# Patient Record
Sex: Male | Born: 1975 | Race: White | Hispanic: No | Marital: Married | Smoking: Current every day smoker
Health system: Southern US, Community
[De-identification: ages and names within clinical notes are randomized; demographics above are authoritative.]

## PROBLEM LIST (undated history)

## (undated) DIAGNOSIS — E785 Hyperlipidemia, unspecified: Secondary | ICD-10-CM

## (undated) HISTORY — DX: Hyperlipidemia, unspecified: E78.5

## (undated) HISTORY — PX: TONSILECTOMY, ADENOIDECTOMY, BILATERAL MYRINGOTOMY AND TUBES: SHX2538

---

## 2016-10-30 ENCOUNTER — Ambulatory Visit (INDEPENDENT_AMBULATORY_CARE_PROVIDER_SITE_OTHER): Payer: Commercial Managed Care - HMO | Admitting: Physician Assistant

## 2016-10-30 ENCOUNTER — Ambulatory Visit (INDEPENDENT_AMBULATORY_CARE_PROVIDER_SITE_OTHER): Payer: Commercial Managed Care - HMO

## 2016-10-30 VITALS — BP 152/90 | HR 99 | Temp 98.0°F | Resp 20 | Ht 69.5 in | Wt 178.0 lb

## 2016-10-30 DIAGNOSIS — R030 Elevated blood-pressure reading, without diagnosis of hypertension: Secondary | ICD-10-CM | POA: Diagnosis not present

## 2016-10-30 DIAGNOSIS — M25511 Pain in right shoulder: Secondary | ICD-10-CM | POA: Diagnosis not present

## 2016-10-30 DIAGNOSIS — M62838 Other muscle spasm: Secondary | ICD-10-CM | POA: Diagnosis not present

## 2016-10-30 MED ORDER — KETOROLAC TROMETHAMINE 60 MG/2ML IM SOLN
60.0000 mg | Freq: Once | INTRAMUSCULAR | Status: AC
  Administered 2016-10-30: 60 mg via INTRAMUSCULAR

## 2016-10-30 MED ORDER — CYCLOBENZAPRINE HCL 10 MG PO TABS: 10.0000 mg | ORAL_TABLET | Freq: Three times a day (TID) | ORAL | 0 refills | Status: AC | PRN

## 2016-10-30 MED ORDER — MELOXICAM 15 MG PO TABS: 15.0000 mg | ORAL_TABLET | Freq: Every day | ORAL | 1 refills | Status: AC

## 2016-10-30 NOTE — Patient Instructions (Addendum)
I recommend resting today. Take medications as prescribed and drink lots of water.   Just to know, flexeril can cause side effects that may impair your thinking or reactions. Be careful if you drive or do anything that requires you to be awake and alert. void drinking alcohol, which can increase some of the side effects of Flexeril.  NSAIDs like meloxicam have common side effects of heartburn, stomach pain, indigestion, and headache. Could lead to renal insufficiency, stroke, or GI bleed if taken excess amounts outside of what is recommended on label long term.    Also, use a heating pad, do not apply directly to skin, use barrier such as towel over the skin. Leave on for 15-20 minutes, 3-4 times a day.  Follow up with me in one week for annual physical exam, please be fasting at this exam so we can check all of your labs. You can make the appointment today.  In the meantime, if your symptoms worsen or you develop any new concerning symptoms, seek care immediately at the ER.       Muscle Cramps and Spasms Muscle cramps and spasms are when muscles tighten by themselves. They usually get better within minutes. Muscle cramps are painful. They are usually stronger and last longer than muscle spasms. Muscle spasms may or may not be painful. They can last a few seconds or much longer. HOME CARE  Drink enough fluid to keep your pee (urine) clear or pale yellow.  Massage, stretch, and relax the muscle.  Use a warm towel, heating pad, or warm shower water on tight muscles.  Place ice on the muscle if it is tender or in pain.  Put ice in a plastic bag.  Place a towel between your skin and the bag.  Leave the ice on for 15-20 minutes, 3-4 times a day.  Only take medicine as told by your doctor. GET HELP RIGHT AWAY IF:  Your cramps or spasms get worse, happen more often, or do not get better with time. MAKE SURE YOU:  Understand these instructions.  Will watch your condition.  Will get  help right away if you are not doing well or get worse. This information is not intended to replace advice given to you by your health care provider. Make sure you discuss any questions you have with your health care provider. Document Released: 09/19/2008 Document Revised: 02/01/2013 Document Reviewed: 07/11/2015 Elsevier Interactive Patient Education  2017 [employer]   IF you received an x-ray today, you will receive an invoice from [facility]. Please contact [facility] at [phone] with questions or concerns regarding your invoice.   IF you received labwork today, you will receive an invoice from [facility]. Please contact [facility] at [phone] with questions or concerns regarding your invoice.   Our billing staff will not be able to assist you with questions regarding bills from these companies.  You will be contacted with the lab results as soon as they are available. The fastest way to get your results is to activate your My Chart account. Instructions are located on the last page of this paperwork. If you have not heard from us regarding the results in 2 weeks, please contact this office.

## 2016-10-30 NOTE — Progress Notes (Signed)
Bryan Combs  MRN: [id number] DOB: Jan 28, 1976  Subjective:  Bryan Combs is a 41 y.o. male seen in office today for a chief complaint of right shoulder pain x 3 days. It started out as a knot in the back that shoots a nerve pain into his neck. For the past day, he has been having a burning spasm pain up through his neck and down into his right arm. Has associated SOB due to pain and slight tingling in his elbow. Denies chest pain, palpitations, and numbness. Has tried tylenol and advil with no relief. Has had issues with his right shoulder before but never this severe.   Review of Systems  Constitutional: Positive for diaphoresis. Negative for chills and fever.  HENT: Negative for congestion and sinus pressure.   Eyes: Negative for visual disturbance.  Respiratory: Negative for cough.   Cardiovascular: Negative for leg swelling.  Genitourinary: Negative for hematuria.  Neurological: Positive for light-headedness. Negative for dizziness and headaches.    There are no active problems to display for this patient.   No current outpatient prescriptions on file prior to visit.   No current facility-administered medications on file prior to visit.     No Known Allergies    Social History   Social History  . Marital status: Married    Spouse name: N/A  . Number of children: N/A  . Years of education: N/A   Occupational History  . Not on file.   Social History Main Topics  . Smoking status: Current Every Day Smoker    Packs/day: 1.00    Years: 20.00  . Smokeless tobacco: Never Used  . Alcohol use Not on file  . Drug use: Unknown  . Sexual activity: Not on file   Other Topics Concern  . Not on file   Social History Narrative  . No narrative on file   Family History  Problem Relation Age of Onset  . Cancer Mother   . Hyperlipidemia Father   . Hypertension Father      Objective:  BP (!) 152/90   Pulse 99   Temp 98 F (36.7 C) (Oral)   Resp 20   Ht 5' 9.5"  (1.765 m)   Wt 178 lb (80.7 kg)   SpO2 100%   BMI 25.91 kg/m   Physical Exam  Constitutional: He is oriented to person, place, and time. He appears distressed (moderate, in pain, lying on exam table not wanting to move).  HENT:  Head: Normocephalic and atraumatic.  Eyes: Conjunctivae are normal.  Neck: Normal range of motion.  Cardiovascular: Regular rhythm, normal heart sounds and intact distal pulses.  Tachycardia present.   Pulmonary/Chest: Effort normal.  Musculoskeletal:       Right shoulder: He exhibits decreased range of motion, tenderness ( along musculature between spine and medial scapula border) and spasm (active, exquite pain with palpation ). He exhibits no bony tenderness.       Cervical back: He exhibits decreased range of motion and tenderness (with palpation of right sided musclature).  Difficult exam due to patient not wanting to move to avoid worsening pain.   Neurological: He is alert and oriented to person, place, and time. Gait normal.  Skin: Skin is warm and dry.  Psychiatric: Affect normal.  Vitals reviewed.   Dg Chest 2 View  Result Date: 10/30/2016 CLINICAL DATA:  Acute shoulder and neck pain. EXAM: CHEST  2 VIEW COMPARISON:  None. FINDINGS: The heart size and mediastinal contours are within normal limits.  Both lungs are clear. No pneumothorax or pleural effusion is noted. The visualized skeletal structures are unremarkable. IMPRESSION: No active cardiopulmonary disease. Electronically Signed   By: Lupita RaiderJames  Green Jr, M.D.   On: 10/30/2016 12:09   After injection of toradol, pt notes his pain is improving, much better than when he first arrived.   Assessment and Plan :  This case was precepted with Dr. Gwendolyn GrantWalden.  1. Acute pain of right shoulder -Resolving after administration of toradol. Instructed to return to clinic if symptoms worsen or he develops any new concerning symptoms discussed in office.  - DG Chest 2 View; Future - ketorolac (TORADOL) injection 60  mg; Inject 2 mLs (60 mg total) into the muscle once. - meloxicam (MOBIC) 15 MG tablet; Take 1 tablet (15 mg total) by mouth daily.  Dispense: 30 tablet; Refill: 1  2. Muscle spasm -Instructed to take medication today and use heating pad to affected area 4-5 x daily for 20 minutes at time.  - cyclobenzaprine (FLEXERIL) 10 MG tablet; Take 1 tablet (10 mg total) by mouth 3 (three) times daily as needed for muscle spasms.  Dispense: 30 tablet; Refill: 0  3. Elevated blood pressure reading -Follow up in one week for recheck bp and for CPE as pt has not had one in a few years.    Benjiman CoreBrittany Bristyn Kulesza PA-C  [facility] 10/30/2016 1:05 PM

## 2016-11-06 ENCOUNTER — Ambulatory Visit: Payer: Commercial Managed Care - HMO | Admitting: Physician Assistant

## 2018-03-30 IMAGING — DX DG CHEST 2V
2 series · 2 of 2 positions shown · non-contrast
Comparison: None.

CLINICAL DATA: Acute shoulder and neck pain.

EXAM:
CHEST  2 VIEW

[chest pa (1 of 2)]
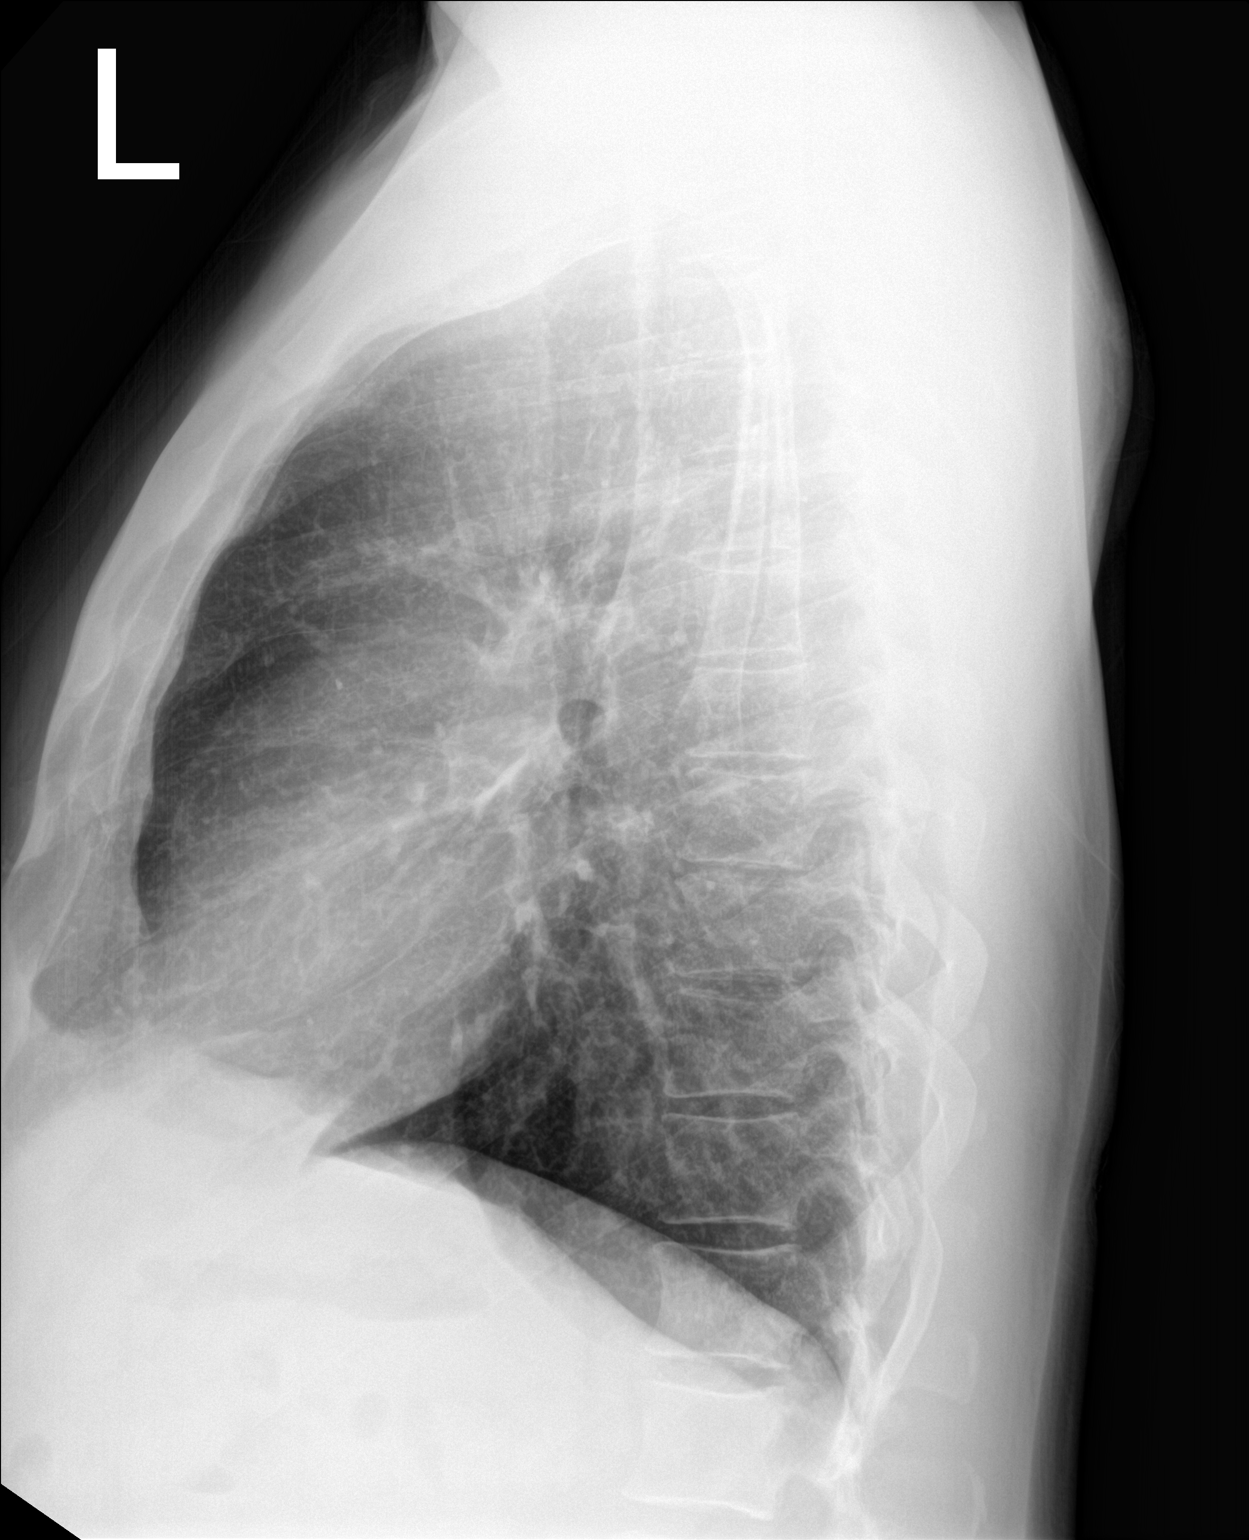

[chest pa (2 of 2)]
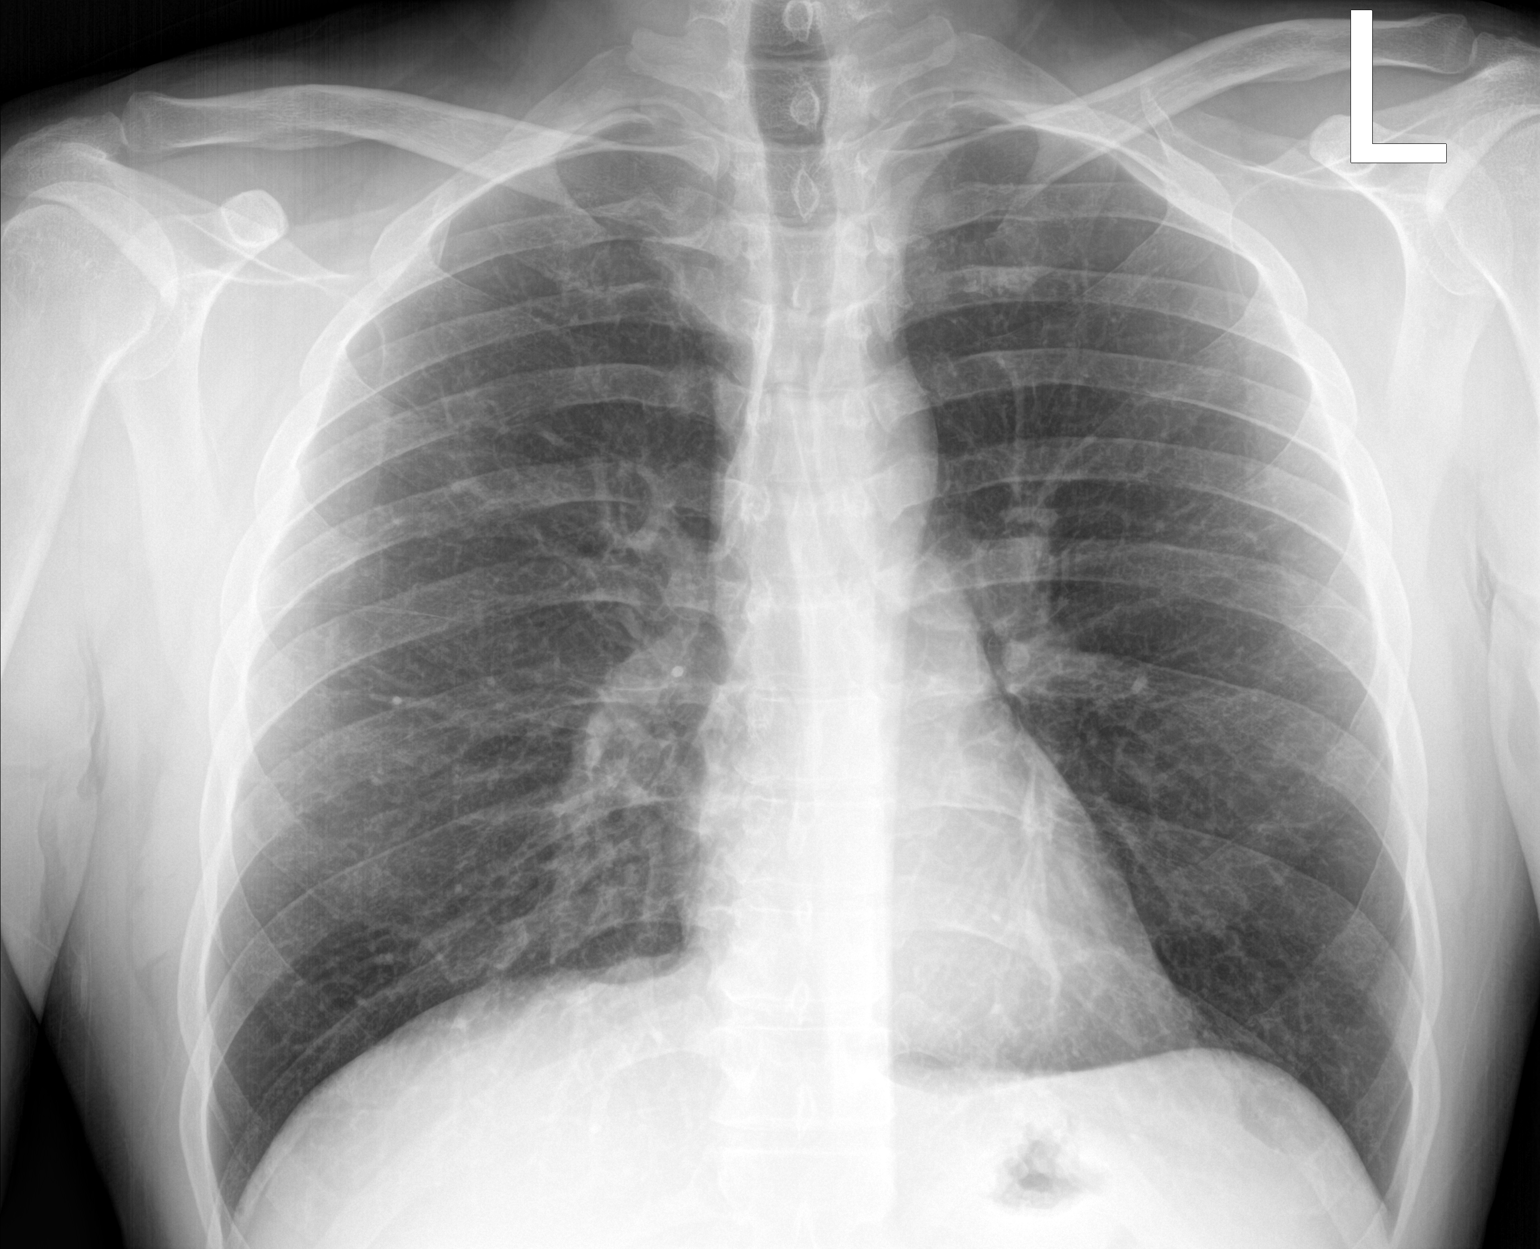

[2 of 2 positions shown; findings below may reference images not displayed]

FINDINGS: The heart size and mediastinal contours are within normal limits.
Both lungs are clear. No pneumothorax or pleural effusion is noted.
The visualized skeletal structures are unremarkable.
IMPRESSION: No active cardiopulmonary disease.
